# Patient Record
Sex: Male | Born: 1970 | Hispanic: Yes | Smoking: Never smoker
Health system: Southern US, Community
[De-identification: ages and names within clinical notes are randomized; demographics above are authoritative.]

## PROBLEM LIST (undated history)

## (undated) DIAGNOSIS — R7303 Prediabetes: Secondary | ICD-10-CM

## (undated) DIAGNOSIS — K7689 Other specified diseases of liver: Secondary | ICD-10-CM

## (undated) DIAGNOSIS — J45909 Unspecified asthma, uncomplicated: Secondary | ICD-10-CM

## (undated) DIAGNOSIS — Z20828 Contact with and (suspected) exposure to other viral communicable diseases: Secondary | ICD-10-CM

## (undated) HISTORY — DX: Contact with and (suspected) exposure to other viral communicable diseases: Z20.828

## (undated) HISTORY — DX: Other specified diseases of liver: K76.89

## (undated) HISTORY — DX: Prediabetes: R73.03

---

## 2013-03-12 ENCOUNTER — Ambulatory Visit: Payer: Self-pay | Admitting: Family Medicine

## 2015-03-28 ENCOUNTER — Encounter: Payer: Self-pay | Admitting: Oncology

## 2015-03-28 ENCOUNTER — Inpatient Hospital Stay: Payer: Managed Care, Other (non HMO)

## 2015-03-28 ENCOUNTER — Ambulatory Visit: Payer: Self-pay | Admitting: Oncology

## 2015-03-28 ENCOUNTER — Inpatient Hospital Stay: Payer: Managed Care, Other (non HMO) | Attending: Oncology | Admitting: Oncology

## 2015-03-28 VITALS — BP 106/69 | HR 66 | Temp 97.2°F | Resp 18 | Ht 72.44 in | Wt 203.5 lb

## 2015-03-28 DIAGNOSIS — N5089 Other specified disorders of the male genital organs: Secondary | ICD-10-CM

## 2015-03-28 DIAGNOSIS — N433 Hydrocele, unspecified: Secondary | ICD-10-CM

## 2015-03-28 DIAGNOSIS — N508 Other specified disorders of male genital organs: Secondary | ICD-10-CM | POA: Diagnosis present

## 2015-03-28 DIAGNOSIS — K7689 Other specified diseases of liver: Secondary | ICD-10-CM | POA: Diagnosis not present

## 2015-03-28 LAB — LACTATE DEHYDROGENASE: LDH: 149 U/L (ref 98–192)

## 2015-03-29 LAB — AFP TUMOR MARKER: AFP-Tumor Marker: 3.3 ng/mL (ref 0.0–8.3)

## 2015-03-29 LAB — BETA HCG QUANT (REF LAB): Beta hCG, Tumor Marker: <1 m[IU]/mL (ref 0–3)

## 2015-04-01 ENCOUNTER — Ambulatory Visit
Admission: RE | Admit: 2015-04-01 | Discharge: 2015-04-01 | Disposition: A | Discharge status: Home / Self Care | Payer: Managed Care, Other (non HMO) | Source: Ambulatory Visit | Attending: Oncology | Admitting: Oncology

## 2015-04-01 DIAGNOSIS — N433 Hydrocele, unspecified: Secondary | ICD-10-CM | POA: Diagnosis not present

## 2015-04-01 DIAGNOSIS — N508 Other specified disorders of male genital organs: Secondary | ICD-10-CM | POA: Diagnosis present

## 2015-04-01 DIAGNOSIS — N5089 Other specified disorders of the male genital organs: Secondary | ICD-10-CM

## 2015-04-04 ENCOUNTER — Ambulatory Visit: Payer: Self-pay | Admitting: Oncology

## 2015-04-08 NOTE — Progress Notes (Signed)
[facility]  Telephone:([phone] Fax:(336) [phone]  ID: TULLIO CHAUSSE OB: [birth year]  MR#: [id number]  UYQ#:[id number]  Patient Care Team: Rayetta Humphrey, MD as PCP - General (Family Medicine)  CHIEF COMPLAINT:  Chief Complaint  Patient presents with  . New Evaluation    testicular mass    INTERVAL HISTORY: Patient is a 44 year old male with no significant past medical history who had a new right testicular mass on self-examination. The mass is nontender. He otherwise feels well. He has no neurologic plates. He has a good appetite and denies weight loss. He has no recent fevers or illnesses. He denies any chest pain or shortness of breath. He denies any nausea, vomiting, constipation, or diarrhea. He has no urinary complaints. Patient otherwise feels well and offers no further specific complaints.  REVIEW OF SYSTEMS:   Review of Systems  Constitutional: Negative.   Genitourinary: Negative.   Musculoskeletal: Negative.     As per HPI. Otherwise, a complete review of systems is negatve.  PAST MEDICAL HISTORY: Past Medical History  Diagnosis Date  . Hepatic cyst   . Prediabetes   . Exposure to herpes     PAST SURGICAL HISTORY: No past surgical history on file.  FAMILY HISTORY No family history on file.     ADVANCED DIRECTIVES:    HEALTH MAINTENANCE: History  Substance Use Topics  . Smoking status: Never Smoker   . Smokeless tobacco: Never Used  . Alcohol Use: No     Colonoscopy:  PAP:  Bone density:  Lipid panel:  No Known Allergies  No current outpatient prescriptions on file.   No current facility-administered medications for this visit.    OBJECTIVE: Filed Vitals:   03/28/15 0910  BP: 106/69  Pulse: 66  Temp: 97.2 F (36.2 C)  Resp: 18     Body mass index is 27.26 kg/(m^2).    ECOG FS:0 - Asymptomatic  General: Well-developed, well-nourished, no acute distress. Eyes: Pink conjunctiva, anicteric sclera. HEENT:  Normocephalic, moist mucous membranes, clear oropharnyx. Lungs: Clear to auscultation bilaterally. Heart: Regular rate and rhythm. No rubs, murmurs, or gallops. Abdomen: Soft, nontender, nondistended. No organomegaly noted, normoactive bowel sounds. Musculoskeletal: No edema, cyanosis, or clubbing. Neuro: Alert, answering all questions appropriately. Cranial nerves grossly intact. Skin: No rashes or petechiae noted. Psych: Normal affect. GU: Mild subcentimeter nodule palpated on right testes.   LAB RESULTS:  No results found for: NA, K, CL, CO2, GLUCOSE, BUN, CREATININE, CALCIUM, PROT, ALBUMIN, AST, ALT, ALKPHOS, BILITOT, GFRNONAA, GFRAA  No results found for: WBC, NEUTROABS, HGB, HCT, MCV, PLT   STUDIES: US Scrotum  04/01/2015   CLINICAL DATA:  Palpable right testicular nodule reported by the patient for the past 3 months  EXAM: ULTRASOUND OF SCROTUM  TECHNIQUE: Complete ultrasound examination of the testicles, epididymis, and other scrotal structures was performed.  COMPARISON:  None.  FINDINGS: Right testicle  Measurements: 5.0 x 2.5 x 2.0 cm. In the inferior aspect of the right testicle there is a heterogeneous echotexture structure with poorly defined borders measuring 1.9 x 1.5 x 1.6 cm. It is not hypervascular. Elsewhere the testicular echotexture is normal.  Left testicle  Measurements: 4.6 x 2.0 x 2.8 cm. No mass or microlithiasis visualized.  Right epididymis:  Normal in size and appearance.  Left epididymis:  Normal in size and appearance.  Hydrocele:  There is a small left-sided hydrocele  Varicocele:  None visualized.  IMPRESSION: 1. A discrete testicular mass is not observed. However, along the  inferior aspect of the right testicle there is a poorly marginated area of heterogeneous echotexture without increased vascularity. This is nonspecific and could reflect a rete testes or true infiltrative mass lesion such as seminoma or metastatic disease. Is there a history of malignancy  elsewhere? 2. The left testis is unremarkable. 3. The epididymal structures are normal. There is a small left-sided hydrocele. 4. Urologic consultation is recommended.  MRI may be useful.   Electronically Signed   By: David  Jordan M.D.   On: 07/05/2[place]016 12:15   [place] Art/ven Flow Abd Pelv Doppler  04/01/2015   CLINICAL DATA:  Palpable right testicular nodule reported by the patient for the past 3 months  EXAM: ULTRASOUND OF SCROTUM  TECHNIQUE: Complete ultrasound examination of the testicles, epididymis, and other scrotal structures was performed.  COMPARISON:  None.  FINDINGS: Right testicle  Measurements: 5.0 x 2.5 x 2.0 cm. In the inferior aspect of the right testicle there is a heterogeneous echotexture structure with poorly defined borders measuring 1.9 x 1.5 x 1.6 cm. It is not hypervascular. Elsewhere the testicular echotexture is normal.  Left testicle  Measurements: 4.6 x 2.0 x 2.8 cm. No mass or microlithiasis visualized.  Right epididymis:  Normal in size and appearance.  Left epididymis:  Normal in size and appearance.  Hydrocele:  There is a small left-sided hydrocele  Varicocele:  None visualized.  IMPRESSION: 1. A discrete testicular mass is not observed. However, along the inferior aspect of the right testicle there is a poorly marginated area of heterogeneous echotexture without increased vascularity. This is nonspecific and could reflect a rete testes or true infiltrative mass lesion such as seminoma or metastatic disease. Is there a history of malignancy elsewhere? 2. The left testis is unremarkable. 3. The epididymal structures are normal. There is a small left-sided hydrocele. 4. Urologic consultation is recommended.  MRI may be useful.   Electronically Signed   By: David  [place] M.D.   On: 04/01/2015 12:15    ASSESSMENT: Testicular nodule.  PLAN:    1. Testicular nodule: Ultrasound results as above with no distinct mass noted. All of patient's tumor markers including AFP, beta hCG, and  LDH all within normal limits. No intervention is needed at this time. Continue to monitor and if nodule increases in size, please refer patient back for further evaluation.  Patient expressed understanding and was in agreement with this plan. He also understands that He can call clinic at any time with any questions, concerns, or complaints.    Jeralyn Ruthsimothy J Finnegan, MD   04/08/2015 10:47 AM

## 2016-04-16 ENCOUNTER — Other Ambulatory Visit: Payer: Self-pay | Admitting: Unknown Physician Specialty

## 2016-04-16 DIAGNOSIS — R0781 Pleurodynia: Secondary | ICD-10-CM

## 2016-04-20 ENCOUNTER — Ambulatory Visit
Admission: RE | Admit: 2016-04-20 | Discharge: 2016-04-20 | Disposition: A | Discharge status: Home / Self Care | Payer: Managed Care, Other (non HMO) | Source: Ambulatory Visit | Attending: Unknown Physician Specialty | Admitting: Unknown Physician Specialty

## 2016-04-20 DIAGNOSIS — R911 Solitary pulmonary nodule: Secondary | ICD-10-CM | POA: Diagnosis not present

## 2016-04-20 DIAGNOSIS — R0781 Pleurodynia: Secondary | ICD-10-CM | POA: Diagnosis not present

## 2016-04-20 DIAGNOSIS — I2510 Atherosclerotic heart disease of native coronary artery without angina pectoris: Secondary | ICD-10-CM | POA: Insufficient documentation

## 2016-10-13 ENCOUNTER — Emergency Department: Payer: Commercial Managed Care - PPO

## 2016-10-13 ENCOUNTER — Encounter: Payer: Self-pay | Admitting: Emergency Medicine

## 2016-10-13 ENCOUNTER — Emergency Department
Admission: EM | Admit: 2016-10-13 | Discharge: 2016-10-13 | Disposition: A | Discharge status: Home / Self Care | Payer: Commercial Managed Care - PPO | Attending: Emergency Medicine | Admitting: Emergency Medicine

## 2016-10-13 DIAGNOSIS — J45909 Unspecified asthma, uncomplicated: Secondary | ICD-10-CM | POA: Insufficient documentation

## 2016-10-13 DIAGNOSIS — J069 Acute upper respiratory infection, unspecified: Secondary | ICD-10-CM | POA: Insufficient documentation

## 2016-10-13 DIAGNOSIS — R51 Headache: Secondary | ICD-10-CM | POA: Diagnosis present

## 2016-10-13 LAB — INFLUENZA PANEL BY PCR (TYPE A & B)
Influenza A By PCR: POSITIVE — AB
Influenza B By PCR: NEGATIVE

## 2016-10-13 MED ORDER — BENZONATATE 100 MG PO CAPS: 100.0000 mg | ORAL_CAPSULE | Freq: Three times a day (TID) | ORAL | 0 refills | Status: AC | PRN

## 2016-10-13 MED ORDER — IPRATROPIUM-ALBUTEROL 0.5-2.5 (3) MG/3ML IN SOLN
3.0000 mL | Freq: Once | RESPIRATORY_TRACT | Status: AC
  Administered 2016-10-13: 3 mL via RESPIRATORY_TRACT
  Filled 2016-10-13: qty 3

## 2016-10-13 MED ORDER — PREDNISONE 10 MG (21) PO TBPK: 10.0000 mg | ORAL_TABLET | Freq: Every day | ORAL | 0 refills | Status: AC

## 2016-10-13 MED ORDER — METHYLPREDNISOLONE SODIUM SUCC 125 MG IJ SOLR
125.0000 mg | Freq: Once | INTRAMUSCULAR | Status: AC
  Administered 2016-10-13: 125 mg via INTRAMUSCULAR
  Filled 2016-10-13: qty 2

## 2016-10-13 NOTE — ED Triage Notes (Signed)
Patient ambulatory to triage with steady gait, without difficulty or distress noted, mask in place; pt reports sinus congestion, nonprod cough & HA x 2 days

## 2016-10-13 NOTE — ED Provider Notes (Signed)
[facility] Emergency Department Provider Note  ____________________________________________  Time seen: Approximately 7:40 AM  I have reviewed the triage vital signs and the nursing notes.   HISTORY  Chief Complaint Nasal Congestion and Cough    HPI Allen Mcmillan is a 46 y.o. male with a history of asthma presenting to the emergency department with frontal headache, nasal congestion and nonproductive cough. Asthma is treated with albuterol PRN.  Additional symptoms include dyspnea worsened with exertion. He denies purulent sputum or rhinorrhea. Patient states that he "thinks" he has been coughing for the past 2 days. He has not evaluated his temperature but has had chills. [place] He has not attempted alleviating measures. He denies sick contacts. He has not experienced diminished appetite. He has stayed hydrated. He denies chest pain, chest tightness, nausea, vomiting, abdominal pain, diarrhea and constipation.  Past Medical History:  Diagnosis Date  . Exposure to herpes   . Hepatic cyst   . Prediabetes     There are no active problems to display for this patient.   History reviewed. No pertinent surgical history.  Prior to Admission medications   Medication Sig Start Date End Date Taking? Authorizing Provider  benzonatate (TESSALON PERLES) 100 MG capsule Take 1 capsule (100 mg total) by mouth 3 (three) times daily as needed for cough. 10/13/16 10/23/16  Orvil FeilJaclyn M Kevontay Burks, PA-C  predniSONE (STERAPRED UNI-PAK 21 TAB) 10 MG (21) TBPK tablet Take 1 tablet (10 mg total) by mouth daily. Take 6 tablets on the first day, take 5 tablets on the second day, take 4 tablets on the third day, take 3 tablets on the fourth day, take 2 tablets on the fifth day, take 1 tablets on the 6 day. 10/13/16   Orvil FeilJaclyn M Leimomi Zervas, PA-C    Allergies Patient has no known allergies.  No family history on file.  Social History Social History  Substance  Use Topics  . Smoking status: Never Smoker  . Smokeless tobacco: Never Used  . Alcohol use No     Review of Systems  Constitutional: Has had chills  Eyes: No visual changes. No discharge ENT: Has had congestion.  Cardiovascular: no chest pain. Respiratory: Has had non-productive cough and dyspnea. Gastrointestinal: No abdominal pain.  No nausea, no vomiting.  No diarrhea.  No constipation. Musculoskeletal: Patient has had myalgias. Skin: Negative for rash, abrasions, lacerations, ecchymosis. Neurological: Has had headache, no focal weakness or numbness.   ____________________________________________   PHYSICAL EXAM:  VITAL SIGNS: ED Triage Vitals  Enc Vitals Group     BP 10/13/16 0434 125/67     Pulse Rate 10/13/16 0434 97     Resp 10/13/16 0434 18     Temp 10/13/16 0434 98.1 F (36.7 C)     Temp Source 10/13/16 0434 Oral     SpO2 10/13/16 0434 98 %     Weight 10/13/16 0435 205 lb (93 kg)     Height 10/13/16 0435 6\' 1"  (1.854 m)     Head Circumference --      Peak Flow --      Pain Score 10/13/16 0435 7     Pain Loc --      Pain Edu? --      Excl. in GC? --    Constitutional: Alert and oriented. Patient is sitting upright. Maintains good eye contact. Eyes: Conjunctivae are normal. PERRL. EOMI. Head: Atraumatic. ENT:      Ears: Tympanic membranes are pearly without evidence  of effusion or purulent exudate. Bony landmarks are visualized bilaterally. No pain with palpation at the tragus.      Nose: Nasal turbinates are edematous and erythematous. Trace rhinorrhea visualized.      Mouth/Throat: Mucous membranes are moist. Posterior pharynx is mildly erythematous. No tonsillar hypertrophy or purulent exudate. Uvula is midline. Neck: Full range of motion. No pain is elicited with flexion at the neck. Hematological/Lymphatic/Immunilogical: No cervical lymphadenopathy. Cardiovascular: Normal rate, regular rhythm. Normal S1 and S2.  Good peripheral  circulation. Respiratory: Normal respiratory effort without tachypnea or retractions. Mild wheezing auscultated at the lung bases bilaterally. Wheezing improved to auscultation after DuoNeb treatment. Gastrointestinal: Bowel sounds 4 quadrants. Soft and nontender to palpation. No guarding or rigidity. No palpable masses. No distention. No CVA tenderness.  Skin:  Skin is warm, dry and intact. No rash noted. Psychiatric: Mood and affect are normal. Speech and behavior are normal. Patient exhibits appropriate insight and judgement.   ____________________________________________   LABS (all labs ordered are listed, but only abnormal results are displayed)  Labs Reviewed  INFLUENZA PANEL BY PCR (TYPE A & B) - Abnormal; Notable for the following:       Result Value   Influenza A By PCR POSITIVE (*)    All other components within normal limits   ____________________________________________  EKG   ____________________________________________  RADIOLOGY Geraldo Pitter, personally viewed and evaluated these images (plain radiographs) as part of my medical decision making, as well as reviewing the written report by the radiologist.   Dg Chest 2 View  Result Date: 10/13/2016 CLINICAL DATA:  Nonproductive cough and chest congestion.  Chills. EXAM: CHEST  2 VIEW COMPARISON:  CT scan of the chest dated 04/20/2016 FINDINGS: The heart size and mediastinal contours are within normal limits. Both lungs are clear. The visualized skeletal structures are unremarkable. IMPRESSION: Normal chest. Electronically Signed   By: Francene Boyers M.D.   On: 10/13/2016 08:36    ____________________________________________    PROCEDURES  Procedure(s) performed:    Procedures    Medications  methylPREDNISolone sodium succinate (SOLU-MEDROL) 125 mg/2 mL injection 125 mg (125 mg Intramuscular Given 10/13/16 0750)  ipratropium-albuterol (DUONEB) 0.5-2.5 (3) MG/3ML nebulizer solution 3 mL (3 mLs  Nebulization Given 10/13/16 0751)     ____________________________________________   INITIAL IMPRESSION / ASSESSMENT AND PLAN / ED COURSE  Pertinent labs & imaging results that were available during my care of the patient were reviewed by me and considered in my medical decision making (see chart for details).  Review of the [place] CSRS was performed in accordance of the [facility] prior to dispensing any controlled drugs.    Assessment and plan: Asthma Exacerbation: Influenza A: Patient presents to the emergency department dyspnea and nonproductive cough. Patient has a history of asthma treated with albuterol as needed. Patient was given DuoNeb and Solu-Medrol in the emergency department. Wheezing improved to auscultation after DuoNeb treatment. Chest x-ray did not reveal consolidations or findings consistent with pneumonia. Patient likely has asthma exacerbation from influenza. Patient education was provided regarding the course of influenza. Rest and hydration were encouraged. Patient was discharged with tapered prednisone. Physical exam and vital signs are reassuring at this time. All patient questions were answered.  ____________________________________________  FINAL CLINICAL IMPRESSION(S) / ED DIAGNOSES  Final diagnoses:  Viral upper respiratory tract infection      NEW MEDICATIONS STARTED DURING THIS VISIT:  Discharge Medication List as of 10/13/2016  8:48 AM    START taking these medications  Details  benzonatate (TESSALON PERLES) 100 MG capsule Take 1 capsule (100 mg total) by mouth 3 (three) times daily as needed for cough., Starting Wed 10/13/2016, Until Sat 10/23/2016, Print    predniSONE (STERAPRED UNI-PAK 21 TAB) 10 MG (21) TBPK tablet Take 1 tablet (10 mg total) by mouth daily. Take 6 tablets on the first day, take 5 tablets on the second day, take 4 tablets on the third day, take 3 tablets on the fourth day, take 2 tablets on the fifth day, take 1 tablets on the 6 day.,  Starting Wed  10/13/2016, Print            This chart was dictated using voice recognition software/Dragon. Despite best efforts to proofread, errors can occur which can change the meaning. Any change was purely unintentional.    Orvil Feil, PA-C 10/14/16 9629    Emily Filbert, MD 10/14/16 [phone]

## 2016-10-13 NOTE — ED Notes (Signed)
Pt verbalized understanding of discharge instructions. NAD at this time. 

## 2017-05-31 ENCOUNTER — Encounter: Payer: Self-pay | Admitting: Emergency Medicine

## 2017-05-31 DIAGNOSIS — J0100 Acute maxillary sinusitis, unspecified: Secondary | ICD-10-CM | POA: Diagnosis not present

## 2017-05-31 DIAGNOSIS — R51 Headache: Secondary | ICD-10-CM | POA: Diagnosis present

## 2017-05-31 NOTE — ED Triage Notes (Signed)
Patient ambulatory to triage with steady gait, without difficulty or distress noted; pt reports today having intermittent frontal/sinus HA with sneezing

## 2017-06-01 ENCOUNTER — Emergency Department
Admission: EM | Admit: 2017-06-01 | Discharge: 2017-06-01 | Disposition: A | Discharge status: Home / Self Care | Payer: Commercial Managed Care - PPO | Attending: Emergency Medicine | Admitting: Emergency Medicine

## 2017-06-01 DIAGNOSIS — R51 Headache: Secondary | ICD-10-CM

## 2017-06-01 DIAGNOSIS — R519 Headache, unspecified: Secondary | ICD-10-CM

## 2017-06-01 DIAGNOSIS — J0100 Acute maxillary sinusitis, unspecified: Secondary | ICD-10-CM

## 2017-06-01 MED ORDER — OXYMETAZOLINE HCL 0.05 % NA SOLN
1.0000 | Freq: Once | NASAL | Status: AC
  Administered 2017-06-01: 1 via NASAL
  Filled 2017-06-01: qty 15

## 2017-06-01 MED ORDER — ACETAMINOPHEN 500 MG PO TABS
1000.0000 mg | ORAL_TABLET | Freq: Once | ORAL | Status: AC
  Administered 2017-06-01: 1000 mg via ORAL
  Filled 2017-06-01: qty 2

## 2017-06-01 MED ORDER — PSEUDOEPHEDRINE HCL 30 MG PO TABS: 30.0000 mg | ORAL_TABLET | Freq: Four times a day (QID) | ORAL | 0 refills | Status: AC | PRN

## 2017-06-01 NOTE — ED Notes (Signed)
No answer when called several times from lobby 

## 2017-06-01 NOTE — ED Notes (Signed)
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  Patient denies pain at this time.

## 2017-06-01 NOTE — ED Provider Notes (Signed)
[facility] Emergency Department Provider Note   ____________________________________________   First MD Initiated Contact with Patient 06/01/17 0255     (approximate)  I have reviewed the triage vital signs and the nursing notes.   HISTORY  Chief Complaint Headache    HPI Allen Mcmillan is a 46 y.o. male who comes into the hospital today with some nasal congestion and headache. The patient states that he works third shift and typically sleeps in the afternoon. He reports that 5 times a day while he was asleep he woke up and had some left-sided nasal congestion. He reports that the last time he became worried and he also had some left-sided headache. He is unsure of its his sinuses or if it something from work that may be irritating him. He reports that his headache was really bad when he initially woke up but he did take some ibuprofen and the pain is down to a 4 out of 10 in intensity. He has some pressure in the left side of his nose and in his left eye has been watering. The patient has no fevers no nausea no vomiting. The patient decided to come in and get checked out for the symptoms.   Past Medical History:  Diagnosis Date  . Exposure to herpes   . Hepatic cyst   . Prediabetes     There are no active problems to display for this patient.   History reviewed. No pertinent surgical history.  Prior to Admission medications   Medication Sig Start Date End Date Taking? Authorizing Provider  predniSONE (STERAPRED UNI-PAK 21 TAB) 10 MG (21) TBPK tablet Take 1 tablet (10 mg total) by mouth daily. Take 6 tablets on the first day, take 5 tablets on the second day, take 4 tablets on the third day, take 3 tablets on the fourth day, take 2 tablets on the fifth day, take 1 tablets on the 6 day. 10/13/16   Orvil Feil, PA-C  pseudoephedrine (SUDAFED) 30 MG tablet Take 1 tablet (30 mg total) by mouth every 6 (six) hours as needed for congestion. 06/01/17 06/01/18   Rebecka Apley, MD    Allergies Patient has no known allergies.  No family history on file.  Social History Social History  Substance Use Topics  . Smoking status: Never Smoker  . Smokeless tobacco: Never Used  . Alcohol use No    Review of Systems  Constitutional: No fever/chills Eyes: No visual changes. ENT: left-sided nasal congestion and sinus pressure Cardiovascular: Denies chest pain. Respiratory: Denies shortness of breath. Gastrointestinal: No abdominal pain.  No nausea, no vomiting.  No diarrhea.  No constipation. Genitourinary: Negative for dysuria. Musculoskeletal: Negative for back pain. Skin: Negative for rash. Neurological: headache   ____________________________________________   PHYSICAL EXAM:  VITAL SIGNS: ED Triage Vitals  Enc Vitals Group     BP 05/31/17 2306 101/76     Pulse Rate 05/31/17 2306 70     Resp 05/31/17 2306 18     Temp 05/31/17 2306 98 F (36.7 C)     Temp Source 05/31/17 2306 Oral     SpO2 05/31/17 2306 97 %     Weight 05/31/17 2304 205 lb (93 kg)     Height 05/31/17 2304 6\' 1"  (1.854 m)     Head Circumference --      Peak Flow --      Pain Score 05/31/17 2304 9     Pain Loc --  Pain Edu? --      Excl. in GC? --     Constitutional: Alert and oriented. Well appearing and in mild distress. Eyes: Conjunctivae are normal. PERRL. EOMI. Head: Atraumatic. Nose: No congestion/rhinnorhea. Tenderness to palpation of left maxillary, ethmoid and frontal sinuses Mouth/Throat: Mucous membranes are moist.  Oropharynx non-erythematous. Cardiovascular: Normal rate, regular rhythm. Grossly normal heart sounds.  Good peripheral circulation. Respiratory: Normal respiratory effort.  No retractions. Lungs CTAB. Gastrointestinal: Soft and nontender. No distention. positive bowel sounds Musculoskeletal: No lower extremity tenderness nor edema.  Neurologic:  Normal speech and language.  Skin:  Skin is warm, dry and intact.    Psychiatric: Mood and affect are normal.   ____________________________________________   LABS (all labs ordered are listed, but only abnormal results are displayed)  Labs Reviewed - No data to display ____________________________________________  EKG  none ____________________________________________  RADIOLOGY  No results found.  ____________________________________________   PROCEDURES  Procedure(s) performed: None  Procedures  Critical Care performed: No  ____________________________________________   INITIAL IMPRESSION / ASSESSMENT AND PLAN / ED COURSE  Pertinent labs & imaging results that were available during my care of the patient were reviewed by me and considered in my medical decision making (see chart for details).  this is a 46 year old male who comes into the hospital today with some nasal congestion, sinus discomfort and headache. I did evaluate the patient and he does have some sinus tenderness to his maxillary ethmoid and frontal sinuses. The patient's headache is improved at this time.the patient states that he did take some ibuprofen so I gave him some Afrin and Tylenol for his pain. I feel that he has some sinusitis. He'll be sent home with Afrin as well as some Sudafed to help with his symptoms. As he has not had any fevers and he's only had symptoms today I feel it appropriate to treat symptomatically. The patient should follow-up with his primary care physician for further evaluation.      ____________________________________________   FINAL CLINICAL IMPRESSION(S) / ED DIAGNOSES  Final diagnoses:  Acute non-recurrent maxillary sinusitis  Acute nonintractable headache, unspecified headache type      NEW MEDICATIONS STARTED DURING THIS VISIT:  Discharge Medication List as of 06/01/2017  3:35 AM    START taking these medications   Details  pseudoephedrine (SUDAFED) 30 MG tablet Take 1 tablet (30 mg total) by mouth every 6 (six) hours  as needed for congestion., Starting Wed 06/01/2017, Until Thu 06/01/2018, Print         Note:  This document was prepared using Dragon voice recognition software and may include unintentional dictation errors.    Rebecka ApleyWebster, Ward Boissonneault P, MD 06/01/17 [phone]

## 2017-08-12 ENCOUNTER — Emergency Department
Admission: EM | Admit: 2017-08-12 | Discharge: 2017-08-12 | Disposition: A | Discharge status: Home / Self Care | Payer: Commercial Managed Care - PPO | Attending: Emergency Medicine | Admitting: Emergency Medicine

## 2017-08-12 ENCOUNTER — Other Ambulatory Visit: Payer: Self-pay

## 2017-08-12 ENCOUNTER — Emergency Department: Payer: Commercial Managed Care - PPO

## 2017-08-12 ENCOUNTER — Encounter: Payer: Self-pay | Admitting: Emergency Medicine

## 2017-08-12 DIAGNOSIS — J45909 Unspecified asthma, uncomplicated: Secondary | ICD-10-CM | POA: Diagnosis not present

## 2017-08-12 DIAGNOSIS — M7918 Myalgia, other site: Secondary | ICD-10-CM | POA: Insufficient documentation

## 2017-08-12 DIAGNOSIS — M545 Low back pain, unspecified: Secondary | ICD-10-CM

## 2017-08-12 DIAGNOSIS — Z79899 Other long term (current) drug therapy: Secondary | ICD-10-CM | POA: Insufficient documentation

## 2017-08-12 HISTORY — DX: Unspecified asthma, uncomplicated: J45.909

## 2017-08-12 MED ORDER — PREDNISONE 20 MG PO TABS
60.0000 mg | ORAL_TABLET | Freq: Once | ORAL | Status: AC
  Administered 2017-08-12: 60 mg via ORAL
  Filled 2017-08-12: qty 3

## 2017-08-12 MED ORDER — PREDNISONE 10 MG (21) PO TBPK: ORAL_TABLET | ORAL | 0 refills | Status: AC

## 2017-08-12 MED ORDER — LIDOCAINE 5 % EX PTCH: 1.0000 | MEDICATED_PATCH | Freq: Two times a day (BID) | CUTANEOUS | 0 refills | Status: AC

## 2017-08-12 MED ORDER — LIDOCAINE 5 % EX PTCH
1.0000 | MEDICATED_PATCH | CUTANEOUS | Status: DC
  Administered 2017-08-12: 1 via TRANSDERMAL
  Filled 2017-08-12: qty 1

## 2017-08-12 NOTE — Discharge Instructions (Signed)
Please follow up with your primary care physician.
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
## 2017-08-12 NOTE — ED Notes (Signed)
Pt ambulatory upon discharge. Verbalized understanding of discharge instructions, follow-up care and prescriptions. Had no questions at this time. VSS. A&O x4. Skin warm and dry.
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
## 2021-06-05 ENCOUNTER — Other Ambulatory Visit: Payer: Self-pay | Admitting: Family Medicine

## 2021-06-05 DIAGNOSIS — N50811 Right testicular pain: Secondary | ICD-10-CM

## 2021-06-16 ENCOUNTER — Ambulatory Visit
Admission: RE | Admit: 2021-06-16 | Discharge: 2021-06-16 | Disposition: A | Discharge status: Home / Self Care | Payer: BC Managed Care – PPO | Source: Ambulatory Visit | Attending: Family Medicine | Admitting: Family Medicine

## 2021-06-16 ENCOUNTER — Other Ambulatory Visit: Payer: Self-pay

## 2021-06-16 DIAGNOSIS — N50811 Right testicular pain: Secondary | ICD-10-CM | POA: Insufficient documentation

## 2022-03-09 ENCOUNTER — Other Ambulatory Visit: Payer: Self-pay | Admitting: Family Medicine

## 2022-03-09 DIAGNOSIS — R109 Unspecified abdominal pain: Secondary | ICD-10-CM

## 2022-03-10 ENCOUNTER — Other Ambulatory Visit: Payer: Self-pay | Admitting: Family Medicine

## 2022-03-10 DIAGNOSIS — R109 Unspecified abdominal pain: Secondary | ICD-10-CM

## 2022-03-10 DIAGNOSIS — R1031 Right lower quadrant pain: Secondary | ICD-10-CM

## 2022-03-11 ENCOUNTER — Ambulatory Visit
Admission: RE | Admit: 2022-03-11 | Discharge: 2022-03-11 | Disposition: A | Discharge status: Home / Self Care | Payer: BC Managed Care – PPO | Source: Ambulatory Visit | Attending: Family Medicine | Admitting: Family Medicine

## 2022-03-11 DIAGNOSIS — R109 Unspecified abdominal pain: Secondary | ICD-10-CM

## 2022-11-11 IMAGING — US US SCROTUM W/ DOPPLER COMPLETE
1 series · 13 of 25 positions shown · non-contrast
Comparison: Scrotal ultrasound 03/02/2015

CLINICAL DATA: History of right testicular cyst/testicular
discomfort.

EXAM:
SCROTAL ULTRASOUND
DOPPLER ULTRASOUND OF THE TESTICLES
TECHNIQUE: Complete ultrasound examination of the testicles, epididymis, and
other scrotal structures was performed. Color and spectral Doppler
ultrasound were also utilized to evaluate blood flow to the
testicles.

[Series 1: us scrotum w/ doppler complete · 0.07mm/px · 13 of 72 slices shown]
[im 1/72]
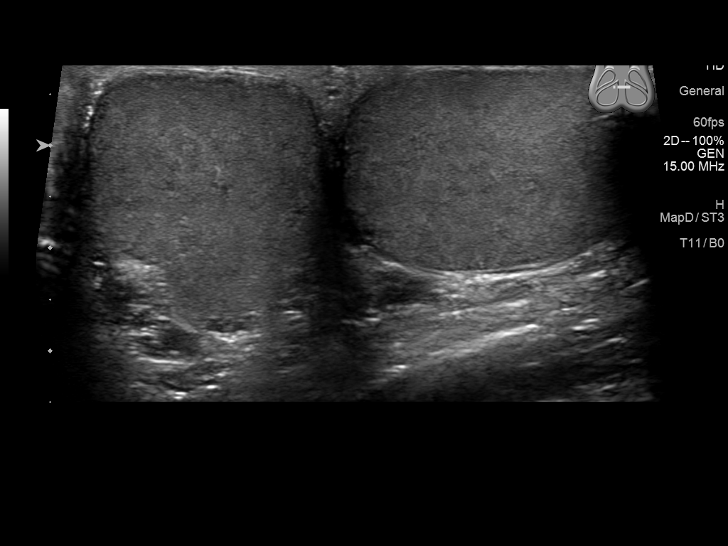
[im 6/72]
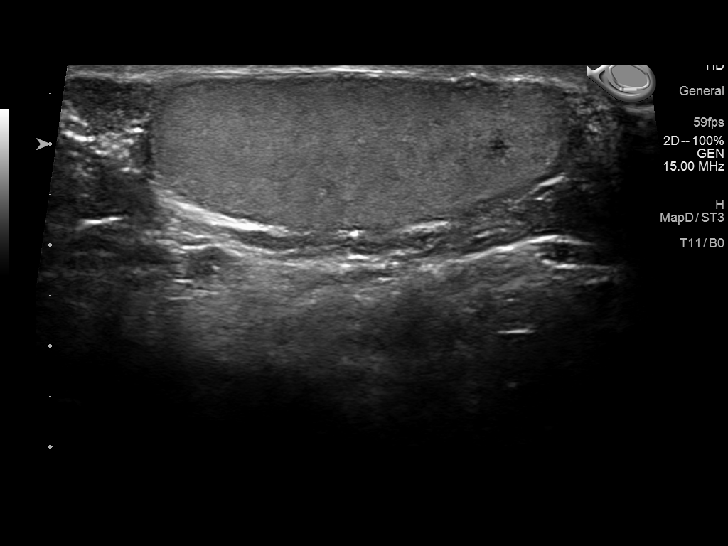
[im 12/72]
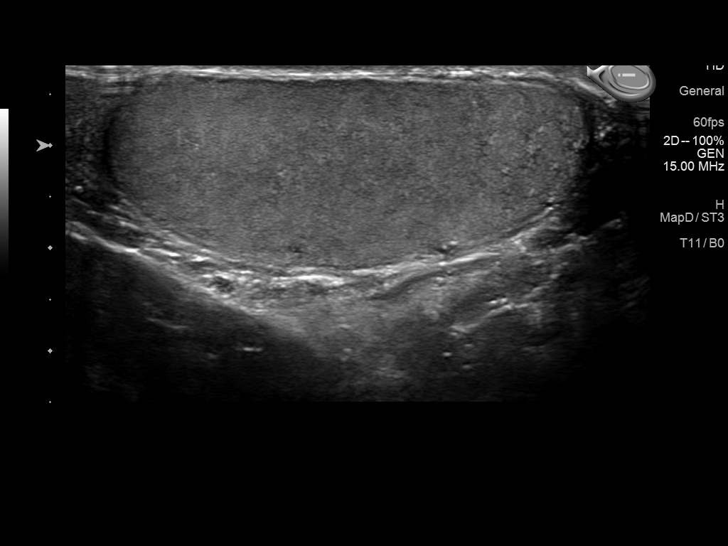
[im 18/72]
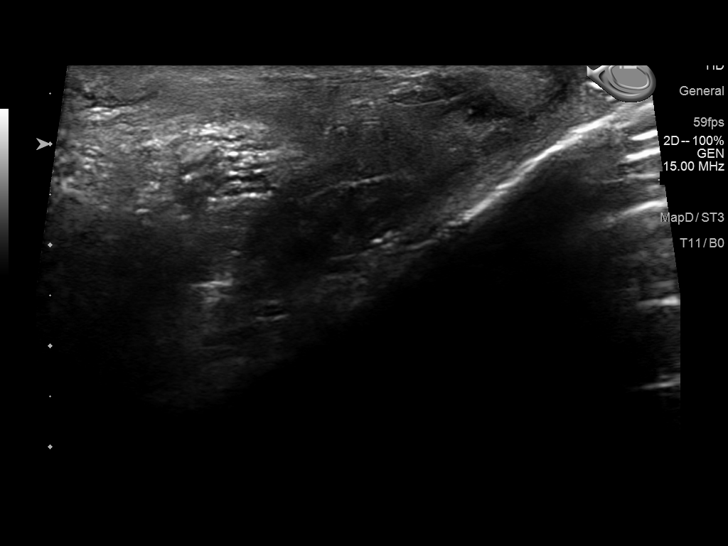
[im 24/72]
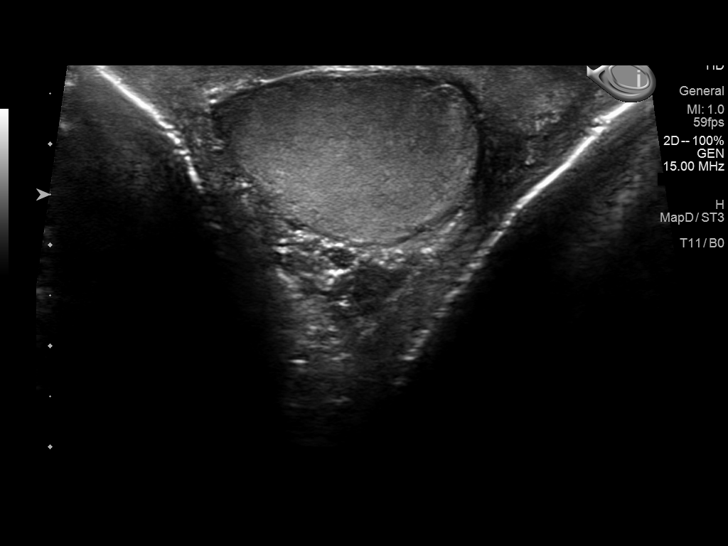
[im 30/72]
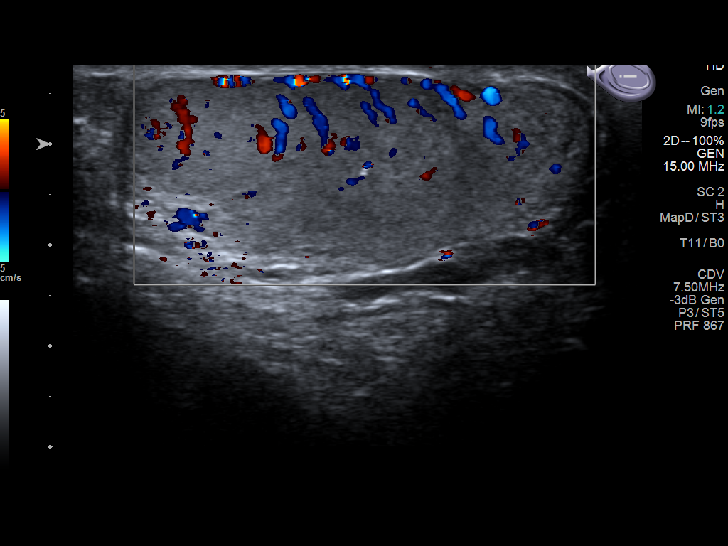
[im 36/72]
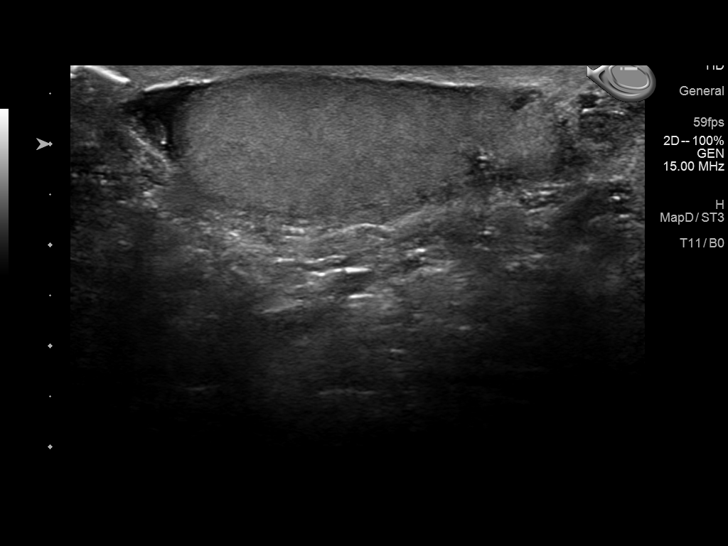
[im 42/72]
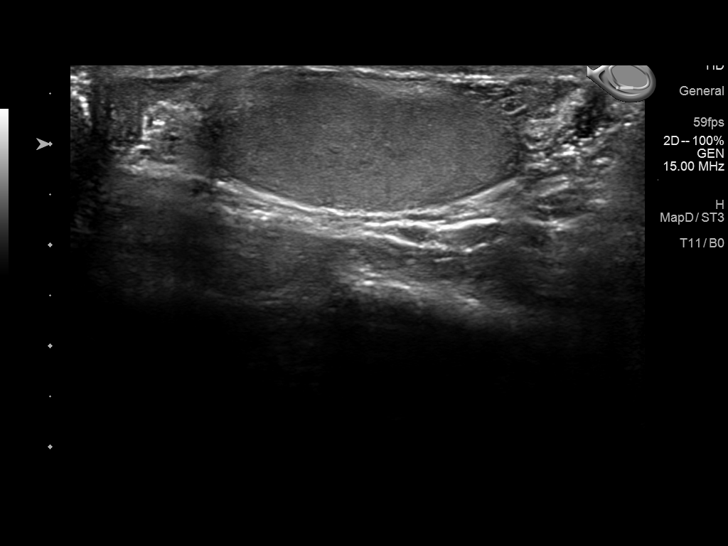
[im 48/72]
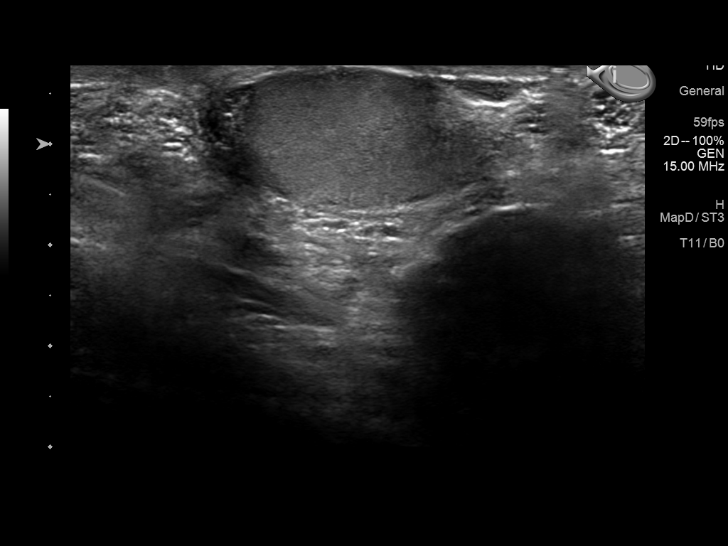
[im 54/72]
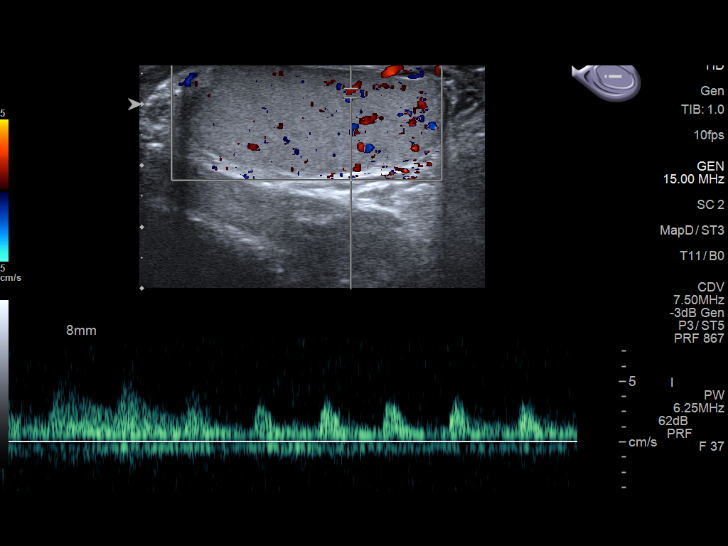
[im 60/72]
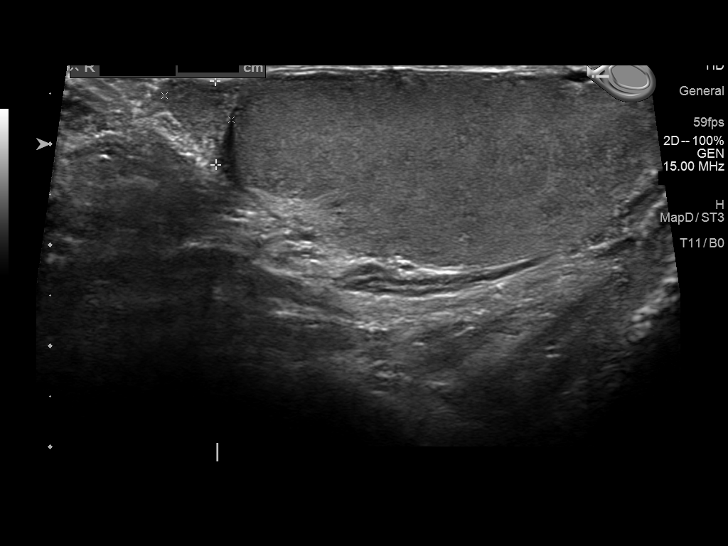
[im 66/72]
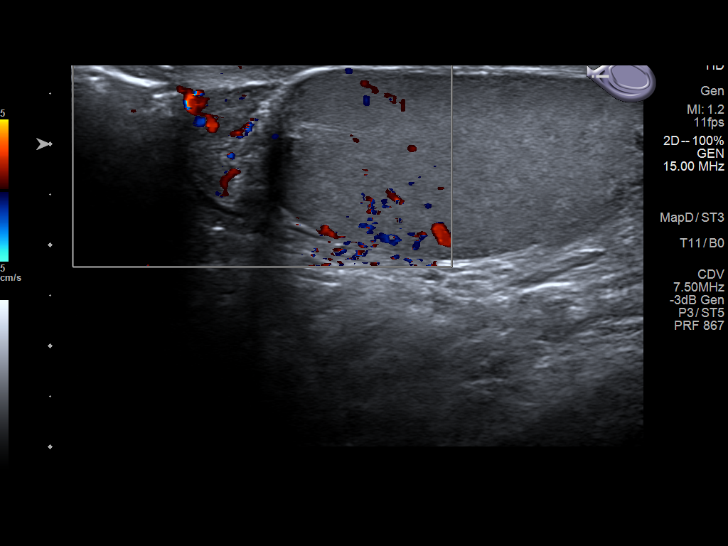
[im 72/72]
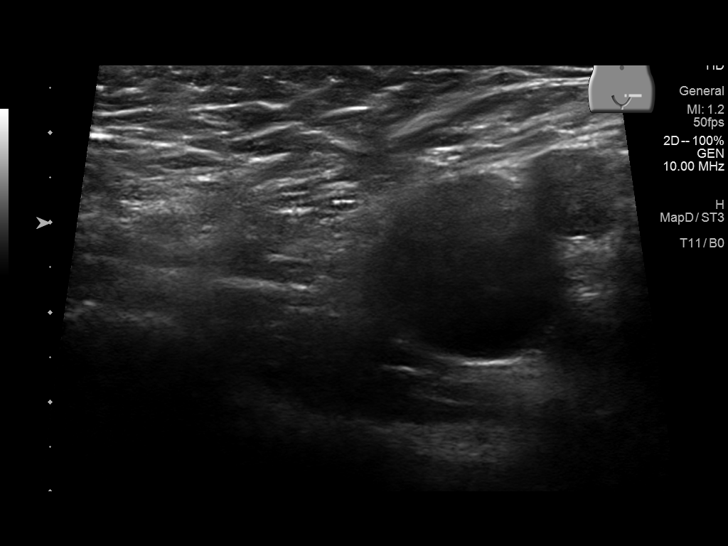

[13 of 25 positions shown; findings below may reference images not displayed]

FINDINGS: Right testicle

Measurements: 4.3 x 2.0 x 2.9 cm. Stable appearance of
heterogeneous echotexture involving the inferior aspect of the right
testicle without well-defined solid mass. There is a 5 x 3 x 4 mm
tunica albuginea cyst that was not well-defined on the prior exam.
Normal testicular blood flow. No microlithiasis.

Left testicle

Measurements: 4.4 x 2.0 x 3.0 cm. Homogeneous echogenicity. Normal
blood flow. No mass or microlithiasis visualized.

Right epididymis:  Normal in size and appearance.

Left epididymis:  Normal in size and appearance.

Hydrocele:  Minimal bilateral.

Varicocele:  None visualized.

Pulsed Doppler interrogation of both testes demonstrates normal low
resistance arterial and venous waveforms bilaterally.

Other: No sonographic evidence of inguinal hernia.
IMPRESSION: 1. Unchanged appearance of heterogeneous echotexture involving the
inferior aspect of the right testis from 2557 without discrete intra
testicular lesion. Stability over the course of many years suggests
this is a benign process such as rete testis. No suspicious
testicular mass.
2. Small 5 mm right tunica albuginea cyst.
3. Small bilateral hydroceles.

## 2023-08-06 IMAGING — US US ABDOMEN LIMITED
1 series · 14 of 25 positions shown · non-contrast
Comparison: None Available.

CLINICAL DATA: Abdominal pain

EXAM:
ULTRASOUND ABDOMEN LIMITED RIGHT UPPER QUADRANT

[Series 1: us abdomen limited · 0.20mm/px · 14 of 42 slices shown]
[im 1/42]
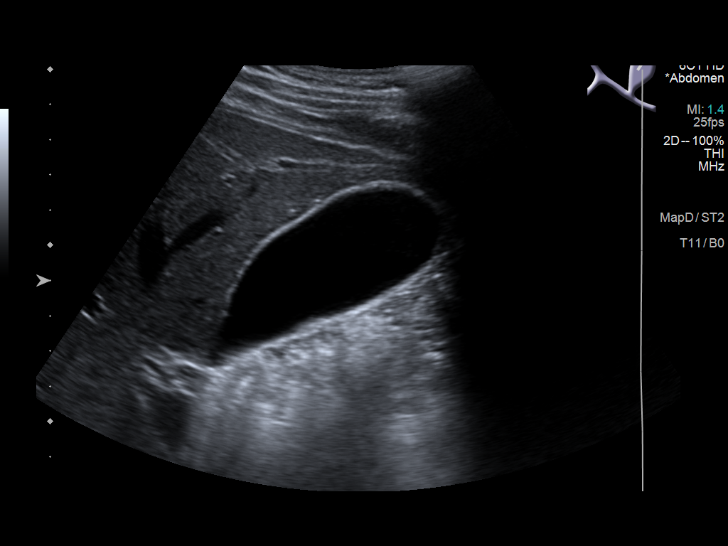
[im 4/42]
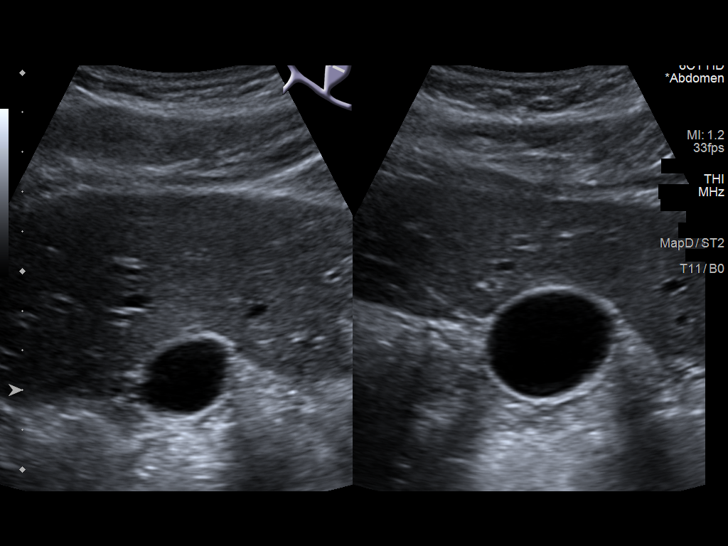
[im 7/42]
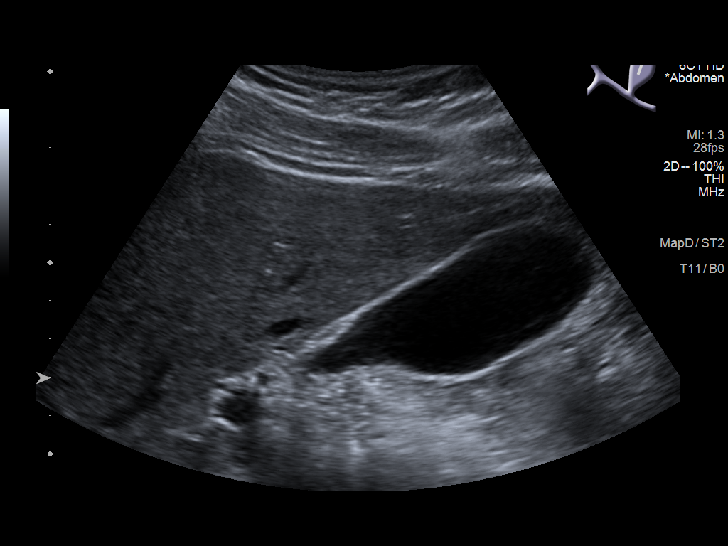
[im 11/42]
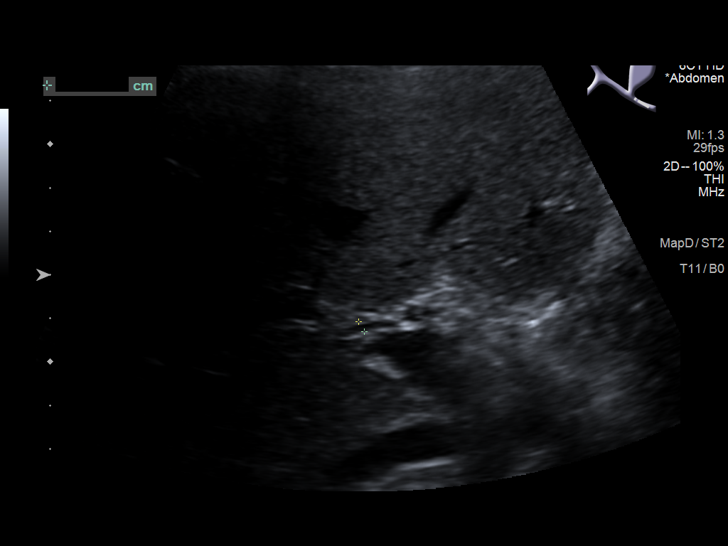
[im 14/42]
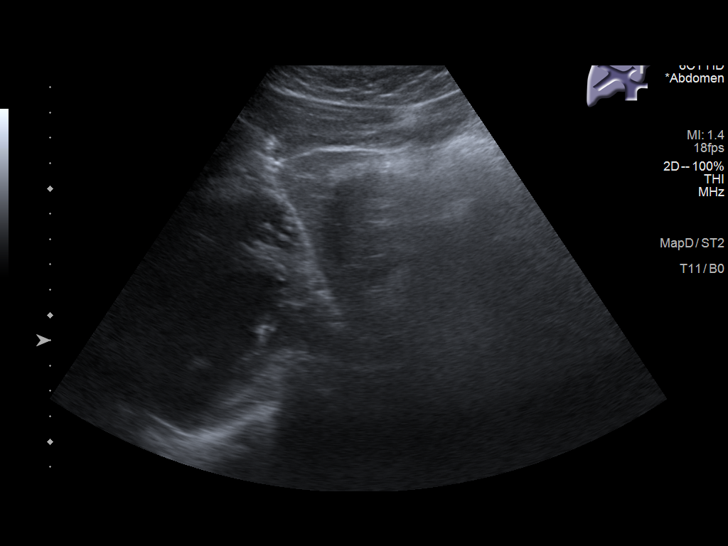
[im 16/42]
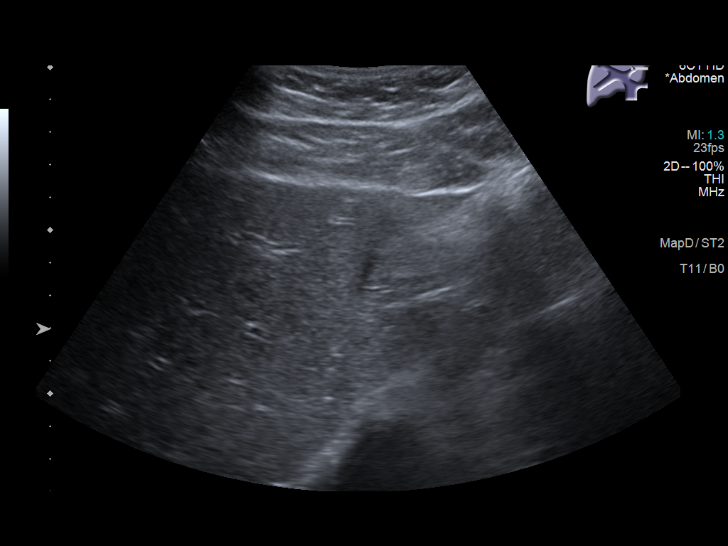
[im 19/42]
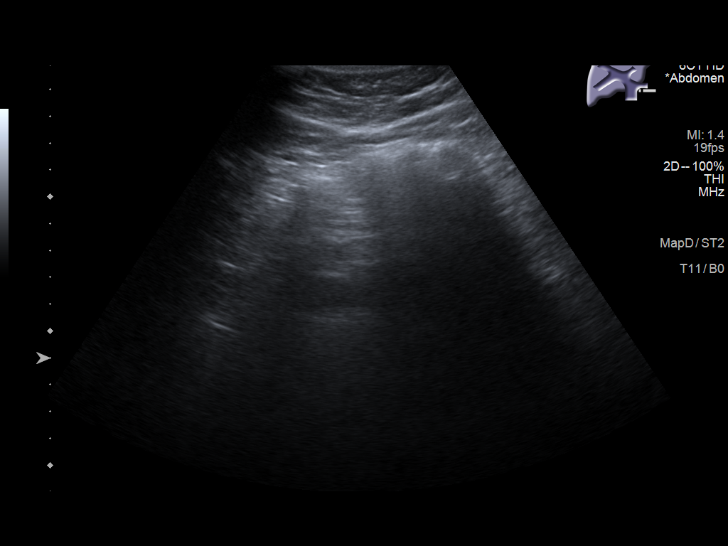
[im 23/42]
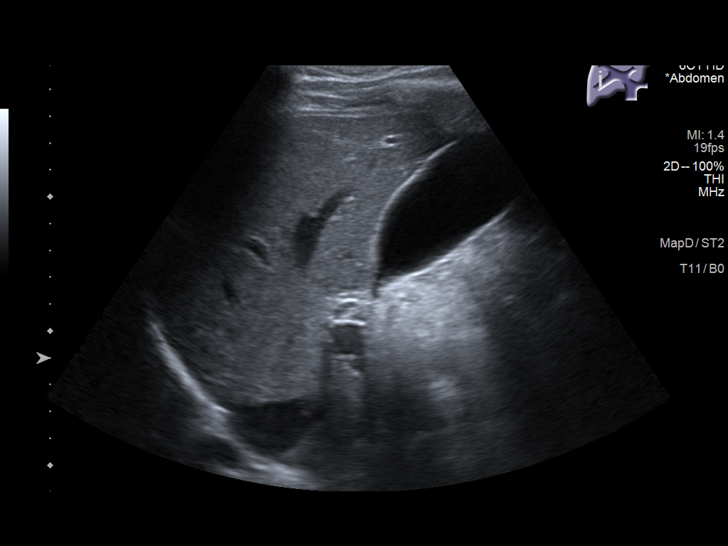
[im 26/42]
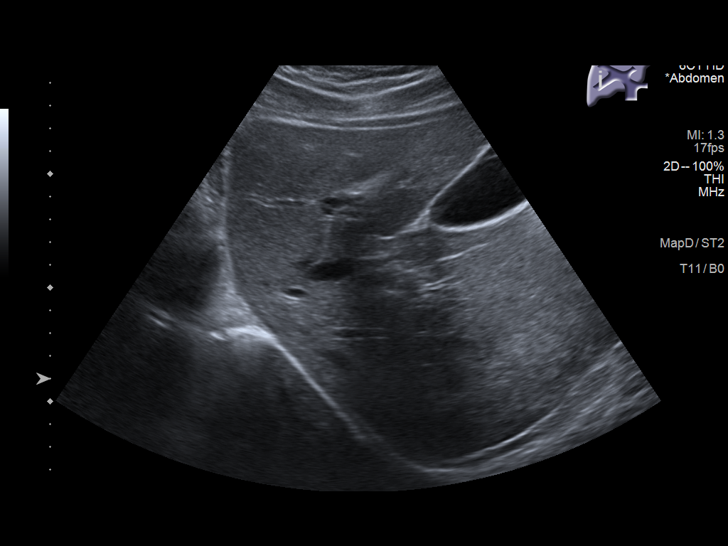
[im 28/42]
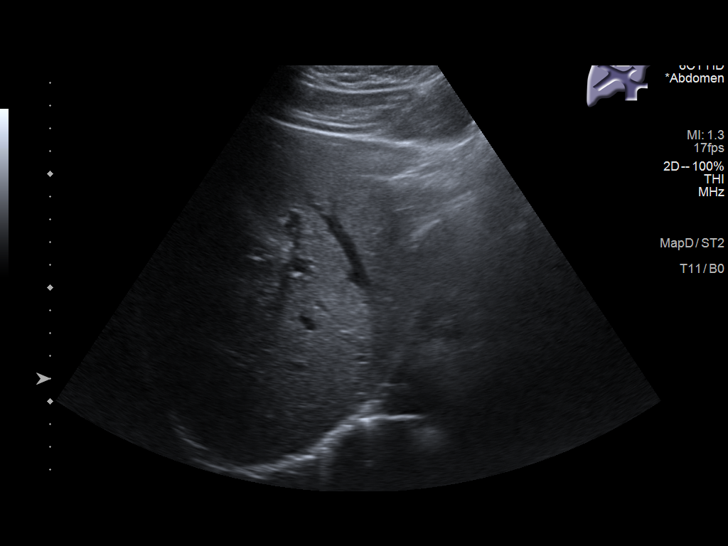
[im 31/42]
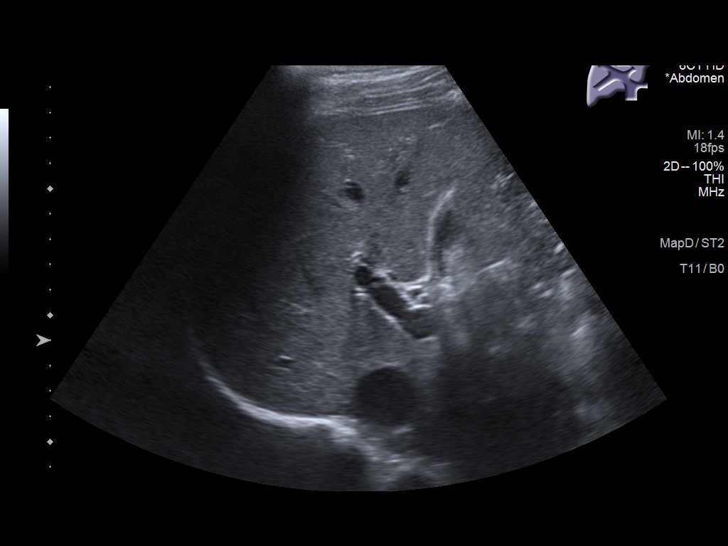
[im 35/42]
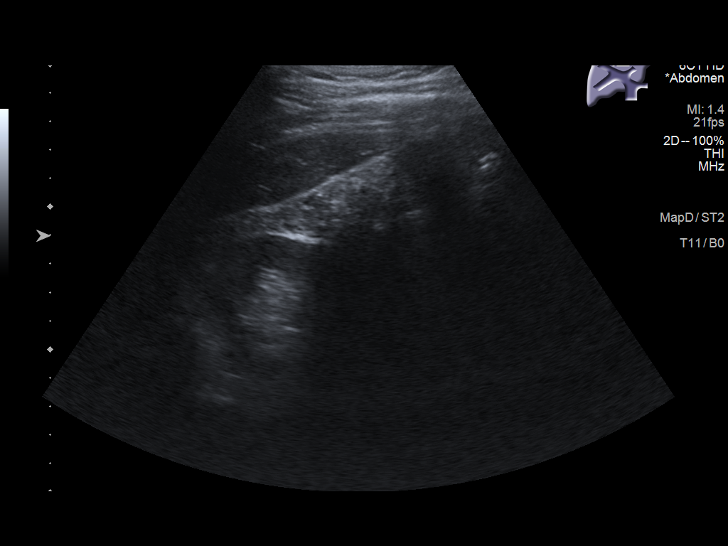
[im 38/42]
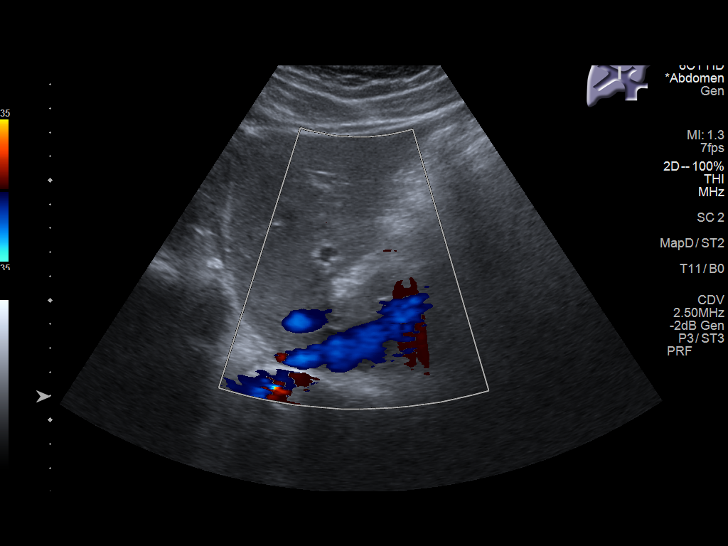
[im 42/42]
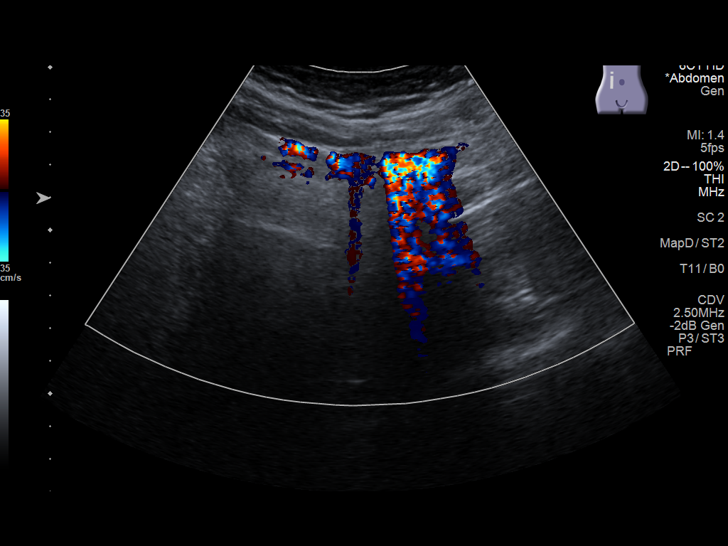

[14 of 25 positions shown; findings below may reference images not displayed]

FINDINGS: Gallbladder:

No gallstones or wall thickening visualized. No sonographic Murphy
sign noted by sonographer.

Common bile duct:

Diameter: 3 mm

Liver:

No focal lesion identified. Within normal limits in parenchymal
echogenicity. Portal vein is patent on color Doppler imaging with
normal direction of blood flow towards the liver.

Other: None.
IMPRESSION: Unremarkable examination.
